# Patient Record
Sex: Female | Born: 1985
Health system: Southern US, Community
[De-identification: ages and names within clinical notes are randomized; demographics above are authoritative.]

## PROBLEM LIST (undated history)

## (undated) ENCOUNTER — Ambulatory Visit (HOSPITAL_COMMUNITY): Discharge status: Absent without leave | Payer: Self-pay

## (undated) DIAGNOSIS — G43909 Migraine, unspecified, not intractable, without status migrainosus: Secondary | ICD-10-CM

## (undated) HISTORY — PX: CHOLECYSTECTOMY: SHX55

---

## 2005-01-29 ENCOUNTER — Emergency Department: Payer: Self-pay | Admitting: Emergency Medicine

## 2005-02-01 ENCOUNTER — Emergency Department: Payer: Self-pay | Admitting: Emergency Medicine

## 2005-09-16 ENCOUNTER — Emergency Department: Payer: Self-pay | Admitting: Emergency Medicine

## 2006-01-25 ENCOUNTER — Emergency Department: Payer: Self-pay | Admitting: Emergency Medicine

## 2006-02-11 ENCOUNTER — Observation Stay: Payer: Self-pay

## 2006-02-24 ENCOUNTER — Ambulatory Visit: Payer: Self-pay | Admitting: Family Medicine

## 2006-03-15 ENCOUNTER — Ambulatory Visit: Payer: Self-pay | Admitting: Family Medicine

## 2006-04-11 ENCOUNTER — Observation Stay: Payer: Self-pay | Admitting: Obstetrics & Gynecology

## 2006-04-14 ENCOUNTER — Ambulatory Visit: Payer: Self-pay | Admitting: Family Medicine

## 2006-04-30 ENCOUNTER — Inpatient Hospital Stay: Payer: Self-pay

## 2006-05-17 ENCOUNTER — Emergency Department: Payer: Self-pay | Admitting: Emergency Medicine

## 2006-10-10 ENCOUNTER — Emergency Department: Payer: Self-pay | Admitting: Emergency Medicine

## 2007-06-16 ENCOUNTER — Emergency Department: Payer: Self-pay | Admitting: Emergency Medicine

## 2007-07-06 ENCOUNTER — Ambulatory Visit: Payer: Self-pay | Admitting: Surgery

## 2007-09-30 ENCOUNTER — Emergency Department: Payer: Self-pay | Admitting: Emergency Medicine

## 2007-10-04 ENCOUNTER — Emergency Department: Payer: Self-pay | Admitting: Internal Medicine

## 2008-01-30 ENCOUNTER — Observation Stay: Payer: Self-pay | Admitting: Obstetrics & Gynecology

## 2008-04-04 ENCOUNTER — Observation Stay: Payer: Self-pay | Admitting: Unknown Physician Specialty

## 2008-04-09 ENCOUNTER — Observation Stay: Payer: Self-pay | Admitting: Unknown Physician Specialty

## 2008-04-11 ENCOUNTER — Inpatient Hospital Stay: Payer: Self-pay | Admitting: Obstetrics & Gynecology

## 2012-10-02 ENCOUNTER — Emergency Department: Payer: Self-pay | Admitting: Emergency Medicine

## 2013-10-11 ENCOUNTER — Emergency Department: Payer: Self-pay | Admitting: Emergency Medicine

## 2013-12-01 ENCOUNTER — Emergency Department: Payer: Self-pay | Admitting: Emergency Medicine

## 2013-12-04 LAB — BETA STREP CULTURE(ARMC)

## 2013-12-21 ENCOUNTER — Emergency Department: Payer: Self-pay | Admitting: Internal Medicine

## 2017-08-01 ENCOUNTER — Emergency Department
Admission: EM | Admit: 2017-08-01 | Discharge: 2017-08-01 | Disposition: A | Discharge status: Home / Self Care | Payer: Self-pay | Attending: Emergency Medicine | Admitting: Emergency Medicine

## 2017-08-01 ENCOUNTER — Emergency Department: Payer: Self-pay

## 2017-08-01 ENCOUNTER — Encounter: Payer: Self-pay | Admitting: Emergency Medicine

## 2017-08-01 DIAGNOSIS — R42 Dizziness and giddiness: Secondary | ICD-10-CM | POA: Insufficient documentation

## 2017-08-01 DIAGNOSIS — G43009 Migraine without aura, not intractable, without status migrainosus: Secondary | ICD-10-CM

## 2017-08-01 LAB — URINALYSIS, COMPLETE (UACMP) WITH MICROSCOPIC
BILIRUBIN URINE: NEGATIVE
Glucose, UA: NEGATIVE mg/dL
HGB URINE DIPSTICK: NEGATIVE
Ketones, ur: NEGATIVE mg/dL
Leukocytes, UA: NEGATIVE
NITRITE: NEGATIVE
PROTEIN: NEGATIVE mg/dL
Specific Gravity, Urine: 1.02 (ref 1.005–1.030)
pH: 7 (ref 5.0–8.0)

## 2017-08-01 LAB — CBC
HEMATOCRIT: 43.9 % (ref 35.0–47.0)
HEMOGLOBIN: 15 g/dL (ref 12.0–16.0)
MCH: 28.8 pg (ref 26.0–34.0)
MCHC: 34.2 g/dL (ref 32.0–36.0)
MCV: 84 fL (ref 80.0–100.0)
Platelets: 305 10*3/uL (ref 150–440)
RBC: 5.23 MIL/uL — ABNORMAL HIGH (ref 3.80–5.20)
RDW: 12.3 % (ref 11.5–14.5)
WBC: 11.3 10*3/uL — ABNORMAL HIGH (ref 3.6–11.0)

## 2017-08-01 LAB — BASIC METABOLIC PANEL
ANION GAP: 9 (ref 5–15)
BUN: 7 mg/dL (ref 6–20)
CHLORIDE: 103 mmol/L (ref 101–111)
CO2: 26 mmol/L (ref 22–32)
Calcium: 9.9 mg/dL (ref 8.9–10.3)
Creatinine, Ser: 0.61 mg/dL (ref 0.44–1.00)
GFR calc Af Amer: >60 mL/min (ref >60)
GLUCOSE: 134 mg/dL — AB (ref 65–99)
POTASSIUM: 3.6 mmol/L (ref 3.5–5.1)
Sodium: 138 mmol/L (ref 135–145)

## 2017-08-01 LAB — PREGNANCY, URINE: PREG TEST UR: NEGATIVE

## 2017-08-01 LAB — POCT PREGNANCY, URINE: Preg Test, Ur: NEGATIVE

## 2017-08-01 MED ORDER — PROCHLORPERAZINE EDISYLATE 5 MG/ML IJ SOLN
10.0000 mg | Freq: Once | INTRAMUSCULAR | Status: AC
  Administered 2017-08-01: 10 mg via INTRAVENOUS
  Filled 2017-08-01: qty 2

## 2017-08-01 MED ORDER — TOPIRAMATE 25 MG PO TABS: 25.0000 mg | ORAL_TABLET | Freq: Every day | ORAL | 2 refills | Status: AC

## 2017-08-01 NOTE — ED Notes (Signed)
Pt encouraged to provide urine sample for UPT.

## 2017-08-01 NOTE — ED Notes (Signed)
SOC complete, clinician to contact EDP

## 2017-08-01 NOTE — ED Provider Notes (Addendum)
[facility] Emergency Department Provider Note   ____________________________________________   First MD Initiated Contact with Patient 08/01/17 1849     (approximate)  I have reviewed the triage vital signs and the nursing notes.   HISTORY  Chief Complaint Headache and Dizziness   HPI Amy Chang is a 31 y.o. female who reports bad frontal headache at least 4 times a week for the last month. She frequently wakes up with it. Last half a day. It is often throbbing. Occasionally she'll have something looked like raindrops in her vision clear raindrops that happen before the headache starts but not always. She has dizziness with it. She closes her eyes even now while she is having headache in the emergency room and feels like the room is moving spinning or swinging back and forth. She is nauseated with them. She does not usually have high blood pressure. In fact I repeat blood pressure in the room the blood pressure on repeat is 143/85.   History reviewed. No pertinent past medical history.  There are no active problems to display for this patient.   Past Surgical History:  Procedure Laterality Date  . CESAREAN SECTION    . CHOLECYSTECTOMY      Prior to Admission medications   Medication Sig Start Date End Date Taking? Authorizing Provider  HYDROcodone-acetaminophen (NORCO/VICODIN) 5-325 MG tablet Take 1 tablet by mouth every 6 (six) hours as needed for moderate pain.   Yes [provider]  topiramate (TOPAMAX) 25 MG tablet Take 1 tablet (25 mg total) by mouth at bedtime. 08/01/17 08/01/18  Arnaldo Natal, MD    Allergies Sulfa antibiotics  History reviewed. No pertinent family history.  Social History Social History  Substance Use Topics  . Smoking status: Never Smoker  . Smokeless tobacco: Never Used  . Alcohol use No    Review of Systems  Constitutional: No fever/chills Eyes: See history of present illness ENT: No sore  throat. Cardiovascular: Denies chest pain. Respiratory: Denies shortness of breath. Gastrointestinal: No abdominal pain. nausea, no vomiting.  No diarrhea.  No constipation. Genitourinary: Negative for dysuria. Musculoskeletal: Negative for back pain. Skin: Negative for rash. Neurological: Negative for focal weakness or numbness.   ____________________________________________   PHYSICAL EXAM:  VITAL SIGNS: ED Triage Vitals  Enc Vitals Group     BP 08/01/17 1759 (!) 153/103     Pulse Rate 08/01/17 1759 73     Resp 08/01/17 1759 18     Temp 08/01/17 1759 97.8 F (36.6 C)     Temp Source 08/01/17 1759 Oral     SpO2 08/01/17 1759 100 %     Weight 08/01/17 1757 220 lb (99.8 kg)     Height 08/01/17 1757 5\' 2"  (1.575 m)     Head Circumference --      Peak Flow --      Pain Score 08/01/17 1757 7     Pain Loc --      Pain Edu? --      Excl. in GC? --     Constitutional: Alert and oriented. ill appearing and in  distress. Eyes: Conjunctivae are normal. PERRL. EOMI. funduscopic appears normal Head: Atraumatic. Nose: No congestion/rhinnorhea. Mouth/Throat: Mucous membranes are moist.  Oropharynx non-erythematous. Neck: No stridor.  Cardiovascular: Normal rate, regular rhythm. Grossly normal heart sounds.  Good peripheral circulation. Respiratory: Normal respiratory effort.  No retractions. Lungs CTAB. Gastrointestinal: Soft and nontender. No distention. No abdominal bruits. No CVA tenderness. Musculoskeletal: No lower  extremity tenderness nor edema.  No joint effusions. Neurologic:  Normal speech and language. No gross focal neurologic deficits are appreciated specifically cranial nerves II through XII are intact of the visual fields were not checked. Finger-nose rapid alternating movements in the hands are normal motor strength is 5 over 5 throughout and sensation is intact. Thrust test indicated shows a little bit of deviation of the eyes that return back to midline as would be the  case if this was peripheral vertigo. There is no nystagmus. Movement of the head does make her dizziness worse Skin:  Skin is warm, dry and intact. No rash noted. Psychiatric: Mood and affect are normal. Speech and behavior are normal.  ____________________________________________   LABS (all labs ordered are listed, but only abnormal results are displayed)  Labs Reviewed  BASIC METABOLIC PANEL - Abnormal; Notable for the following:       Result Value   Glucose, Bld 134 (*)    All other components within normal limits  CBC - Abnormal; Notable for the following:    WBC 11.3 (*)    RBC 5.23 (*)    All other components within normal limits  URINALYSIS, COMPLETE (UACMP) WITH MICROSCOPIC - Abnormal; Notable for the following:    Color, Urine YELLOW (*)    APPearance HAZY (*)    Bacteria, UA RARE (*)    Squamous Epithelial / LPF 0-5 (*)    All other components within normal limits  PREGNANCY, URINE  POC URINE PREG, ED  POCT PREGNANCY, URINE   ____________________________________________  EKG   ____________________________________________  RADIOLOGY  IMPRESSION: No acute intracranial abnormality noted.   Electronically Signed   By: Alcide Clever M.D.   On: 08/01/2017 20:54  ____________________________________________   PROCEDURES  Procedure(s) performed:  Procedures  Critical Care performed:   ____________________________________________   INITIAL IMPRESSION / ASSESSMENT AND PLAN / ED COURSE  Pertinent labs & imaging results that were available during my care of the patient were reviewed by me and considered in my medical decision making (see chart for details).  L neurology feels these are migraines recommends Topamax however when I talked to the patient she has had a bad reaction to Topamax and can't take them I will have her follow-up with neurology here for a different prophylactic medication and use Motrin Tylenol and caffeine for the time being.        ____________________________________________   FINAL CLINICAL IMPRESSION(S) / ED DIAGNOSES  Final diagnoses:  Migraine without aura and without status migrainosus, not intractable      NEW MEDICATIONS STARTED DURING THIS VISIT:  New Prescriptions   TOPIRAMATE (TOPAMAX) 25 MG TABLET    Take 1 tablet (25 mg total) by mouth at bedtime.     Note:  This document was prepared using Dragon voice recognition software and may include unintentional dictation errors.    Arnaldo Natal, MD 08/01/17 2252    Arnaldo Natal, MD 08/01/17 2252

## 2017-08-01 NOTE — ED Notes (Signed)
SOC in progress.  

## 2017-08-01 NOTE — ED Notes (Signed)
SOC at bedside. 

## 2017-08-01 NOTE — ED Triage Notes (Signed)
Pt c/o headache when she woke up this morning.  Also c/o being dizzy and lightheaded.  NAD. VSS.  Has been having at least 3-4 headaches a week for last month.  Is nauseated. No photophobia.  Pain is to frontal area.

## 2017-08-01 NOTE — Discharge Instructions (Signed)
The neurologist thinks you have migraines I agree. He wants to give you Topamax to help prevent them. I am trying to put out the side effects of that but now not having any luck with the computer. Please ask the pharmacist. In general Topamax is very safe though. Sometimes it can make you sleepy so because of driving until he R short does not make you sleepy. He should take it every day to help prevent the migraines. I watched her to follow-up with neurologist here in town I believe Dr. Malvin Johns is on-call I will put his name in the computer. He also works with Dr. Clelia Croft they're both very good. They can increase the amount of Topamax if needed to help keep the migraines from coming back. It may take several weeks of using the Topamax and increasing the dose him before you have and affect that shows a decrease in the amount of migraines. If you start getting another headache as soon as she started getting the headache I would take 2 regular Tylenol and then for over-the-counter Motrin and drink a Coke or couple of strong coffee. All 3 of those together may help prevent the migraine. But that only works if you start right when the headache begins. Please return if you need another dose of the Compazine for severe headache.

## 2017-08-01 NOTE — ED Notes (Signed)
Patient stated they had problems with taking Topamax while MD was at bedside and he tore up the prescription.  Patient will f/u with neuro to determine best course of treatment.

## 2017-08-01 NOTE — ED Notes (Signed)
Patient transported to CT 

## 2017-10-20 ENCOUNTER — Emergency Department
Admission: EM | Admit: 2017-10-20 | Discharge: 2017-10-20 | Disposition: A | Discharge status: Home / Self Care | Payer: Self-pay | Attending: Emergency Medicine | Admitting: Emergency Medicine

## 2017-10-20 ENCOUNTER — Encounter: Payer: Self-pay | Admitting: Emergency Medicine

## 2017-10-20 ENCOUNTER — Other Ambulatory Visit: Payer: Self-pay

## 2017-10-20 ENCOUNTER — Emergency Department: Payer: Self-pay

## 2017-10-20 DIAGNOSIS — R079 Chest pain, unspecified: Secondary | ICD-10-CM | POA: Insufficient documentation

## 2017-10-20 DIAGNOSIS — G4489 Other headache syndrome: Secondary | ICD-10-CM | POA: Insufficient documentation

## 2017-10-20 HISTORY — DX: Migraine, unspecified, not intractable, without status migrainosus: G43.909

## 2017-10-20 LAB — CBC
HCT: 46.6 % (ref 35.0–47.0)
Hemoglobin: 15.7 g/dL (ref 12.0–16.0)
MCH: 28.8 pg (ref 26.0–34.0)
MCHC: 33.6 g/dL (ref 32.0–36.0)
MCV: 85.8 fL (ref 80.0–100.0)
PLATELETS: 307 10*3/uL (ref 150–440)
RBC: 5.43 MIL/uL — AB (ref 3.80–5.20)
RDW: 12.7 % (ref 11.5–14.5)
WBC: 8.4 10*3/uL (ref 3.6–11.0)

## 2017-10-20 LAB — TROPONIN I

## 2017-10-20 LAB — BASIC METABOLIC PANEL
ANION GAP: 7 (ref 5–15)
BUN: 10 mg/dL (ref 6–20)
CALCIUM: 9.5 mg/dL (ref 8.9–10.3)
CHLORIDE: 106 mmol/L (ref 101–111)
CO2: 25 mmol/L (ref 22–32)
Creatinine, Ser: 0.63 mg/dL (ref 0.44–1.00)
GFR calc Af Amer: >60 mL/min (ref >60)
GFR calc non Af Amer: >60 mL/min (ref >60)
GLUCOSE: 114 mg/dL — AB (ref 65–99)
Potassium: 3.5 mmol/L (ref 3.5–5.1)
Sodium: 138 mmol/L (ref 135–145)

## 2017-10-20 MED ORDER — GI COCKTAIL ~~LOC~~
30.0000 mL | Freq: Once | ORAL | Status: AC
  Administered 2017-10-20: 30 mL via ORAL
  Filled 2017-10-20: qty 30

## 2017-10-20 MED ORDER — ONDANSETRON 4 MG PO TBDP
4.0000 mg | ORAL_TABLET | Freq: Once | ORAL | Status: AC
  Administered 2017-10-20: 4 mg via ORAL

## 2017-10-20 MED ORDER — ONDANSETRON 4 MG PO TBDP
ORAL_TABLET | ORAL | Status: DC
Start: 2017-10-20 — End: 2017-10-20
  Filled 2017-10-20: qty 1

## 2017-10-20 NOTE — ED Notes (Addendum)
Prior to being wheeled out to lobby, pt having emesis. EDP informed. Orders received. Pt in stretcher at this time. Denies nausea or abdominal pain. States emesis starts without warning.

## 2017-10-20 NOTE — ED Triage Notes (Signed)
Patient to ER for c/o mid chest pain that began approx 1-2 hours prior to arrival. Patient reports having migraine earlier in the day. Denies taking any medications other than IBU. States she also feels "shaky".

## 2017-10-20 NOTE — ED Notes (Addendum)
Pt reports CP and feeling "shaky" that began last night. Denies pain at this time. Pt alert and oriented X4, active, cooperative, pt in NAD. RR even and unlabored, color WNL.  Pt given apple juice. Attempt X 1 for repeat stick for troponin unsuccessful.

## 2017-10-20 NOTE — ED Notes (Signed)
Pt alert and oriented X4, active, cooperative, pt in NAD. RR even and unlabored, color WNL.  Pt informed to return if any life threatening symptoms occur.  Discharge and followup instructions reviewed. Pt able to ambulate independently. Pt was able to eat and drink. One episode of emesis only.

## 2017-10-20 NOTE — Discharge Instructions (Signed)
Please follow up with your primary care physician.

## 2017-10-20 NOTE — ED Notes (Signed)
Pt had emesis X 1.

## 2017-10-20 NOTE — ED Notes (Signed)
Pt wheeled out to car by husband. No emesis at this time. Pt alert and oriented X4, active, cooperative, pt in NAD. RR even and unlabored, color WNL.

## 2017-10-20 NOTE — ED Provider Notes (Addendum)
[facility] Emergency Department Provider Note   ____________________________________________   First MD Initiated Contact with Patient 10/20/17 [phone]     (approximate)  I have reviewed the triage vital signs and the nursing notes.   HISTORY  Chief Complaint Chest Pain    HPI Amy HecklerJennifer N Blann is a 31 y.o. female who comes into the hospital today with a migraine as well as some chest pain. She reports that she woke up with a bad migraine. Her headache didn't go away during the day. She reports that she then started having some nausea and her chest was hurting. The patient states that the pain started around 2:30. She reports that the chest pain with a headache that would come and go. Nothing seems to make it better or worse. She took some ibuprofen for the headache but didn't take anything for the chest pain. Currently the patient has some dizziness and lightheadedness but no shortness of breath. Her headache at this time is gone as is her chest pain. The patient denies any radiation of her pain. She was concerned she decided to come in to the hospital for further evaluation.   Past Medical History:  Diagnosis Date  . Migraines     There are no active problems to display for this patient.   Past Surgical History:  Procedure Laterality Date  . CESAREAN SECTION    . CHOLECYSTECTOMY      Prior to Admission medications   Medication Sig Start Date End Date Taking? Authorizing Provider  HYDROcodone-acetaminophen (NORCO/VICODIN) 5-325 MG tablet Take 1 tablet by mouth every 6 (six) hours as needed for moderate pain.    [provider]  topiramate (TOPAMAX) 25 MG tablet Take 1 tablet (25 mg total) by mouth at bedtime. Patient not taking: Reported on 10/20/2017 08/01/17 08/01/18  Arnaldo NatalMalinda, Paul F, MD    Allergies Sulfa antibiotics  No family history on file.  Social History Social History   Tobacco Use  . Smoking status: Never Smoker  .  Smokeless tobacco: Never Used  Substance Use Topics  . Alcohol use: No  . Drug use: No    Review of Systems  Constitutional: No fever/chills Eyes: No visual changes. ENT: No sore throat. Cardiovascular:  chest pain. Respiratory: Denies shortness of breath. Gastrointestinal: Nausea with No abdominal pain. no vomiting.  No diarrhea.  No constipation. Genitourinary: Negative for dysuria. Musculoskeletal: Negative for back pain. Skin: Negative for rash. Neurological: Headache, dizziness and lightheadedness   ____________________________________________   PHYSICAL EXAM:  VITAL SIGNS: ED Triage Vitals [10/20/17 0400]  Enc Vitals Group     BP 132/89     Pulse Rate 95     Resp 20     Temp 97.8 F (36.6 C)     Temp Source Oral     SpO2 99 %     Weight      Height 5\' 2"  (1.575 m)     Head Circumference      Peak Flow      Pain Score 2     Pain Loc      Pain Edu?      Excl. in GC?     Constitutional: Alert and oriented. Well appearing and in mild distress. Eyes: Conjunctivae are normal. PERRL. EOMI. Head: Atraumatic. Nose: No congestion/rhinnorhea. Mouth/Throat: Mucous membranes are moist.  Oropharynx non-erythematous. Cardiovascular: Normal rate, regular rhythm. Grossly normal heart sounds.  Good peripheral circulation. Respiratory: Normal respiratory effort.  No retractions. Lungs CTAB. Gastrointestinal: Soft and  nontender. No distention. Positive bowel sounds Musculoskeletal: No lower extremity tenderness nor edema.  Neurologic:  Normal speech and language. No gross focal neurologic deficits are appreciated. No gait instability. Skin:  Skin is warm, dry and intact.  Psychiatric: Mood and affect are normal.   ____________________________________________   LABS (all labs ordered are listed, but only abnormal results are displayed)  Labs Reviewed  BASIC METABOLIC PANEL - Abnormal; Notable for the following components:      Result Value   Glucose, Bld 114 (*)     All other components within normal limits  CBC - Abnormal; Notable for the following components:   RBC 5.43 (*)    All other components within normal limits  TROPONIN I  TROPONIN I   ____________________________________________  EKG  ED ECG REPORT I, Rebecka ApleyWebster,  Allison P, the attending physician, personally viewed and interpreted this ECG.   Date: 10/20/2017  EKG Time: 355  Rate: 81  Rhythm: normal sinus rhythm  Axis: Normal  Intervals:none  ST&T Change: none  ____________________________________________  RADIOLOGY  No results found.  ____________________________________________   PROCEDURES  Procedure(s) performed: None  Procedures  Critical Care performed: No  ____________________________________________   INITIAL IMPRESSION / ASSESSMENT AND PLAN / ED COURSE  As part of my medical decision making, I reviewed the following data within the electronic MEDICAL RECORD NUMBER Notes from prior ED visits and [place] Controlled Substance Database   This is a 31 year old female who comes into the hospital today with some headache and chest pain. The patient does have a history of migraines but reports that the chest pain was new. The patient states that started around 2:30 and seemed to come and go. Right now the patient is not having any pain.  Diagnosis includes acute coronary syndrome, pneumonia, musculoskeletal pain, gastroesophageal reflux, anxiety.  We did check some blood work on the patient's initial blood work was negative. The patient's chest x-ray also was negative and didn't show any pneumonia. The patient has no pain at this time I will also give her a GI cocktail. We will repeat the patient's troponin 3 hours and then reassess the patient.  Clinical Course as of Oct 25 52  Tue Oct 20, 2017  [id number] Patient developed vomiting at time of discharge.  Workup  unremarkable and was asymptomatic when last reassessed per Dr. Zenda AlpersWebster.  Will reassess after zofran; anticipate  patient will still be able to go home.   [SS]    Clinical Course User Index [SS] Dionne BucySiadecki, Sebastian, MD    The patient's repeat troponin came back negative. The patient's pain is improved at this time. She'll be discharged home to follow-up with her primary care physician. ____________________________________________   FINAL CLINICAL IMPRESSION(S) / ED DIAGNOSES  Final diagnoses:  Chest pain, unspecified type  Other headache syndrome      Note:  This document was prepared using Dragon voice recognition software and may include unintentional dictation errors.    Rebecka ApleyWebster, Allison P, MD 10/20/17 [id number]    Rebecka ApleyWebster, Allison P, MD 10/25/17 [phone]

## 2017-10-20 NOTE — ED Notes (Signed)
Patient transported to X-ray 

## 2019-03-09 IMAGING — CT CT HEAD W/O CM
3 series · 16 of 44 positions shown, 19 images · non-contrast
Comparison: None.

CLINICAL DATA: Headaches for several hours

EXAM:
CT HEAD WITHOUT CONTRAST
TECHNIQUE: Contiguous axial images were obtained from the base of the skull
through the vertex without intravenous contrast.

[Series 2: head wo · axial · 0.40mm/px · z∈[+370,+480]mm · 10 of 27 slices shown, 13 images]
[im 3/27  brain]
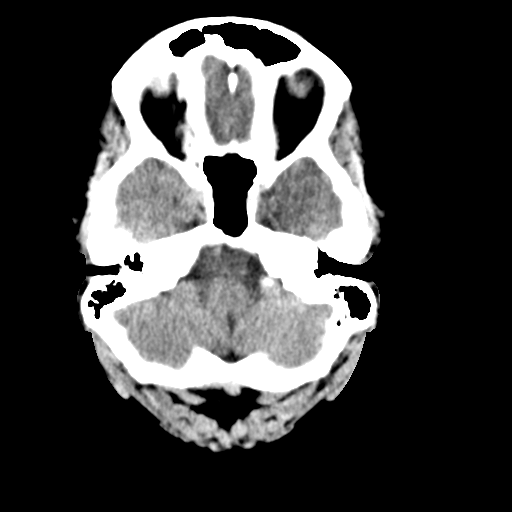
[im 3/27  bone]
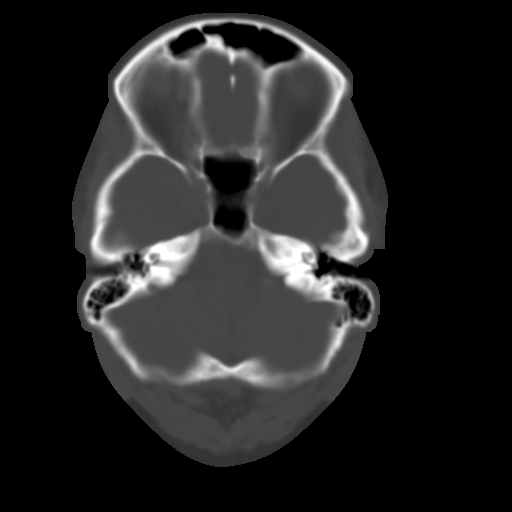
[im 5/27  brain]
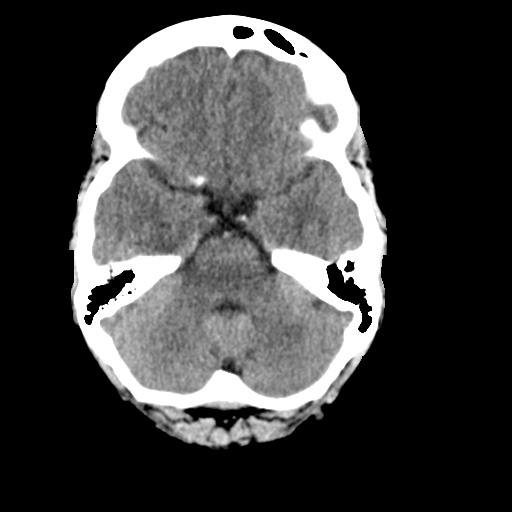
[im 8/27  brain]
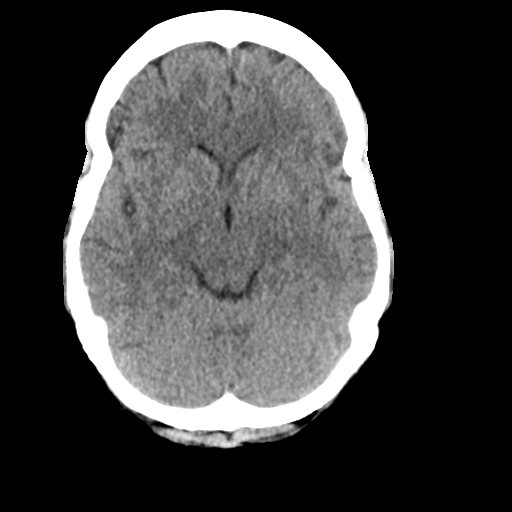
[im 10/27  brain]
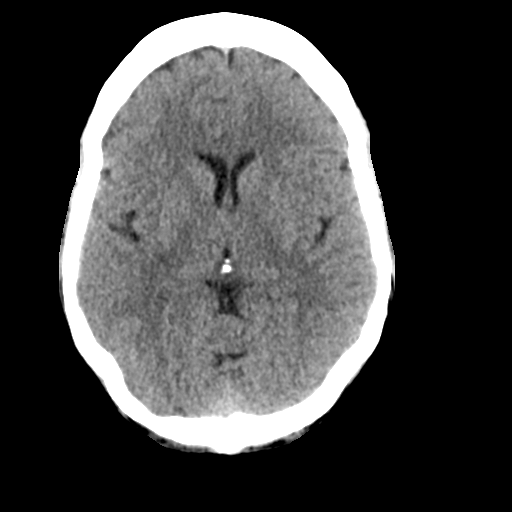
[im 13/27  brain]
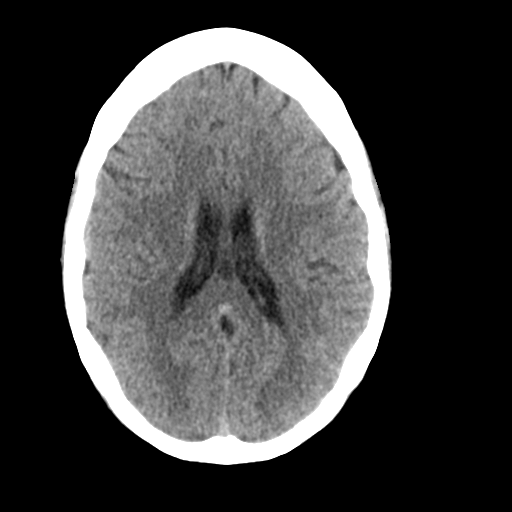
[im 13/27  bone]
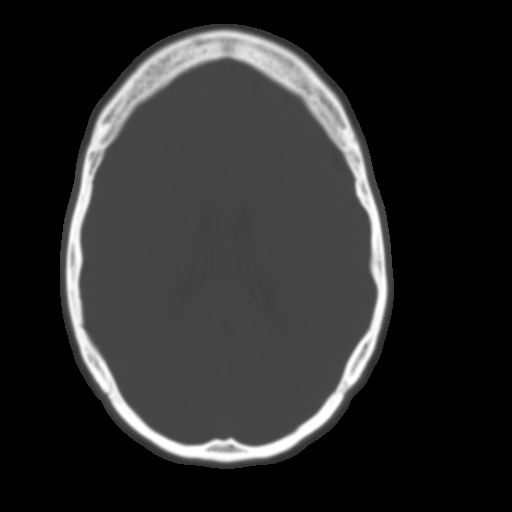
[im 15/27  brain]
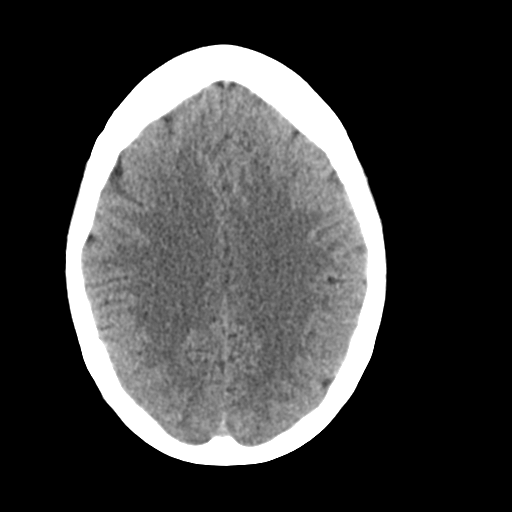
[im 18/27  brain]
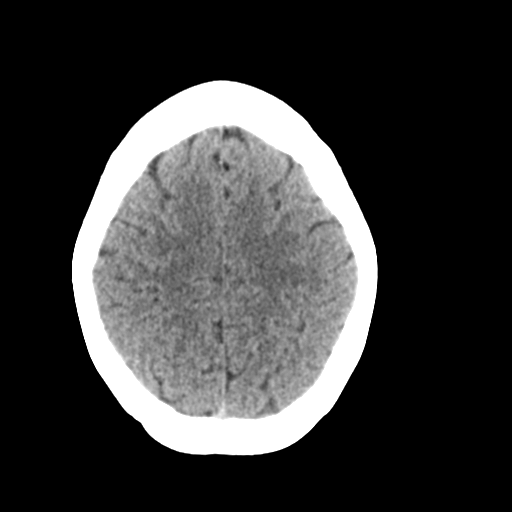
[im 20/27  brain]
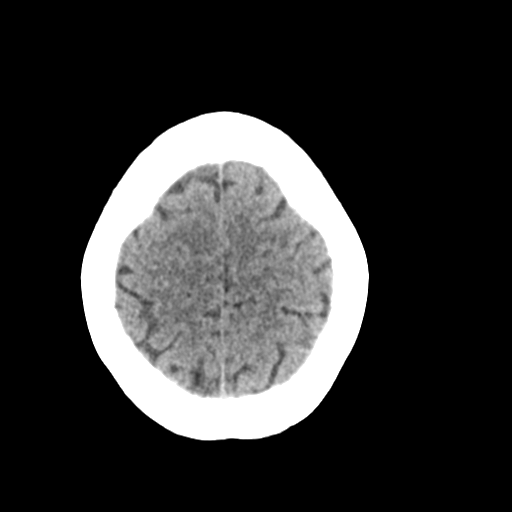
[im 23/27  brain]
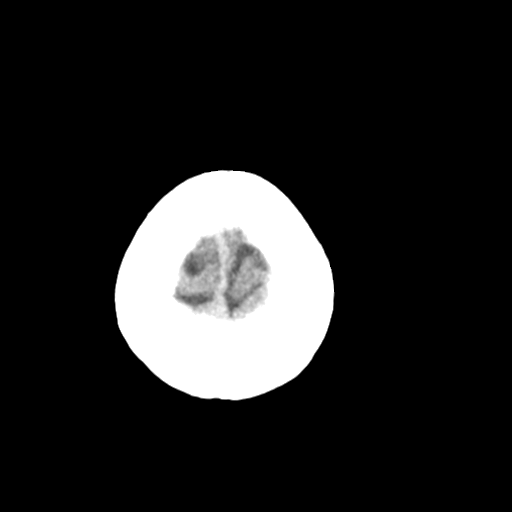
[im 23/27  bone]
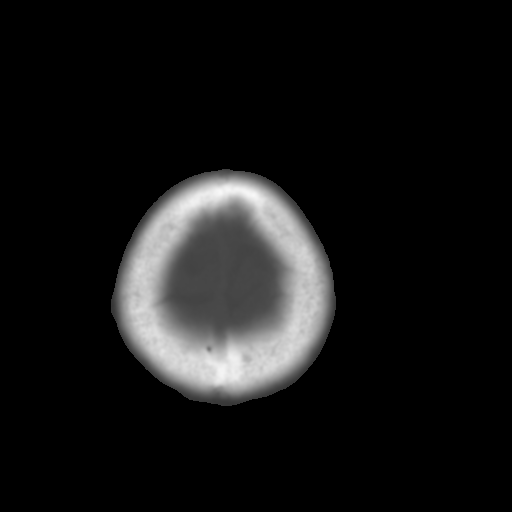
[im 25/27  brain]
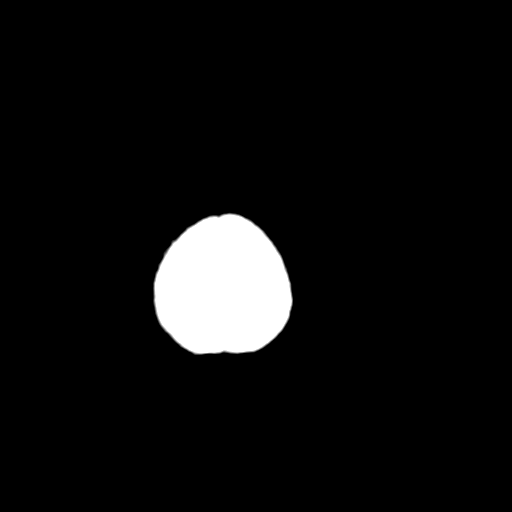

[Series 4: coronal soft tissue · coronal · 0.26mm/px · 3 of 62 slices shown]
[im 21/62  brain]
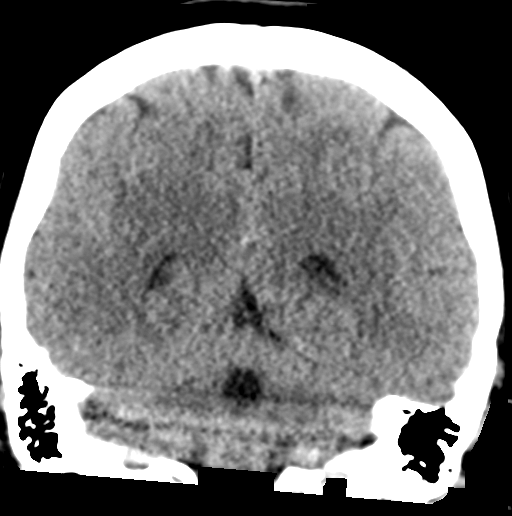
[im 28/62  brain]
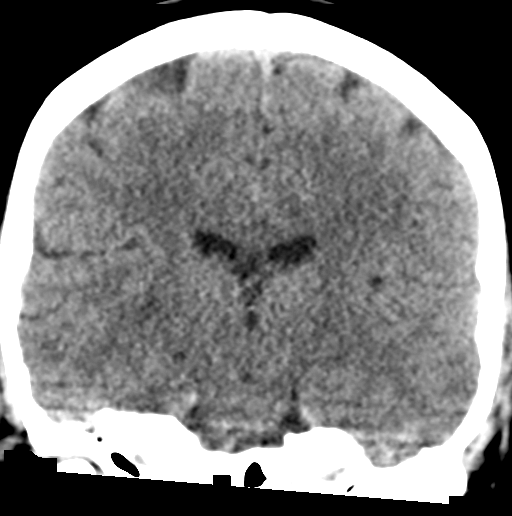
[im 34/62  brain]
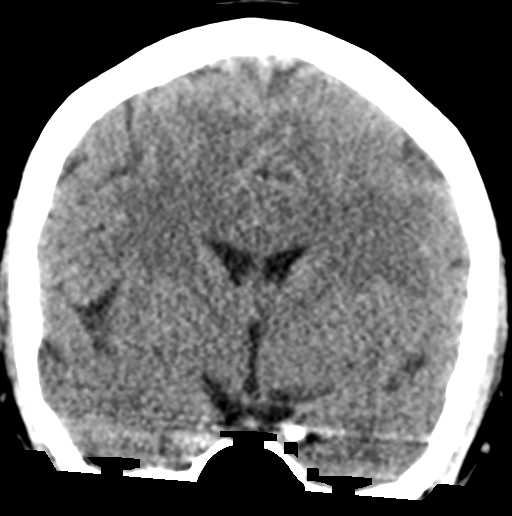

[Series 5: sagittal soft tissue · sagittal · 0.26mm/px · 3 of 45 slices shown]
[im 15/45  brain]
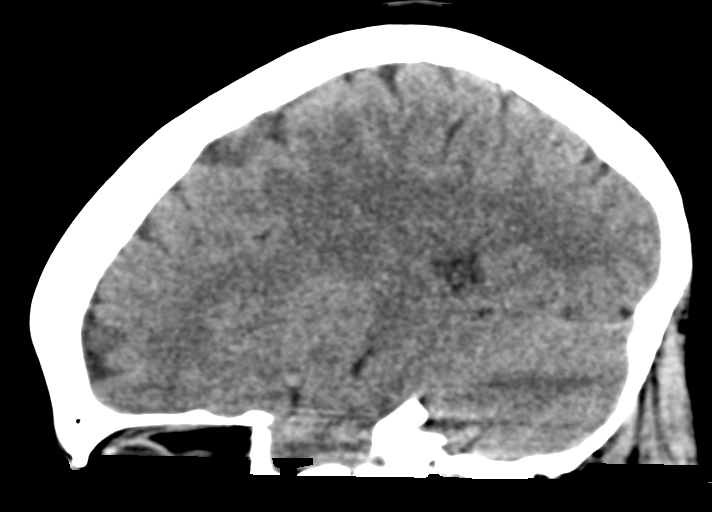
[im 23/45  brain]
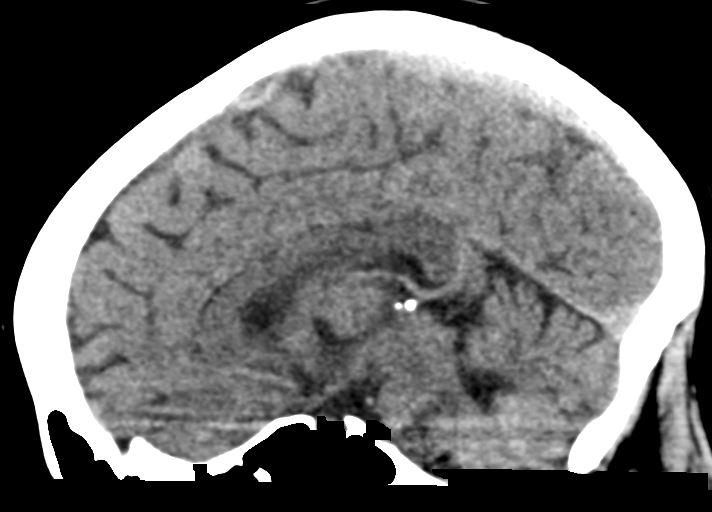
[im 30/45  brain]
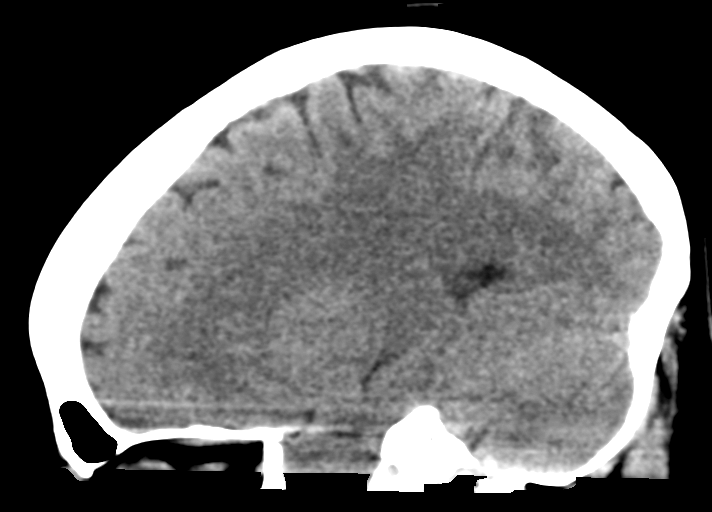

[16 of 44 positions shown; findings below may reference images not displayed]

FINDINGS: Brain: No evidence of acute infarction, hemorrhage, hydrocephalus,
extra-axial collection or mass lesion/mass effect.

Vascular: No hyperdense vessel or unexpected calcification.

Skull: Normal. Negative for fracture or focal lesion.

Sinuses/Orbits: No acute finding.

Other: None.
IMPRESSION: No acute intracranial abnormality noted.

## 2020-08-10 ENCOUNTER — Ambulatory Visit: Payer: Medicaid Other | Attending: Internal Medicine

## 2020-08-10 DIAGNOSIS — Z23 Encounter for immunization: Secondary | ICD-10-CM

## 2020-08-10 NOTE — Progress Notes (Signed)
   Covid-19 Vaccination Clinic  Name:  Amy Chang    MRN: [id number] DOB: [birth year]  08/10/2020  [name] was observed post Covid-19 immunization for 15 minutes without incident. She was provided with Vaccine Information Sheet and instruction to access the V-Safe system.   [name] was instructed to call 911 with any severe reactions post vaccine: Marland Kitchen Difficulty breathing  . Swelling of face and throat  . A fast heartbeat  . A bad rash all over body  . Dizziness and weakness   Immunizations Administered    Name Date Dose VIS Date Route   Pfizer COVID-19 Vaccine 08/10/2020 11:33 AM 0.3 mL 02/08/2019 Intramuscular   Manufacturer: [employer], [employer]   Lot: [id number]   NDC: [address]-9

## 2020-08-31 ENCOUNTER — Ambulatory Visit: Payer: Medicaid Other
